# Patient Record
Sex: Female | Born: 1990 | Race: White | Hispanic: No | Marital: Single | Smoking: Never smoker
Health system: Southern US, Community
[De-identification: ages and names within clinical notes are randomized; demographics above are authoritative.]

## PROBLEM LIST (undated history)

## (undated) HISTORY — PX: OTHER SURGICAL HISTORY: SHX169

---
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
## 2011-12-30 IMAGING — CR DG CERVICAL SPINE 2 OR 3 VIEWS
2 series · 2 of 2 positions shown · non-contrast
Comparison: None.

CLINICAL DATA: 5 years chronic neck pain (soccer player) with no
specific injury.

CERVICAL SPINE - 2-3 VIEW

[view not recorded (1 of 2)]
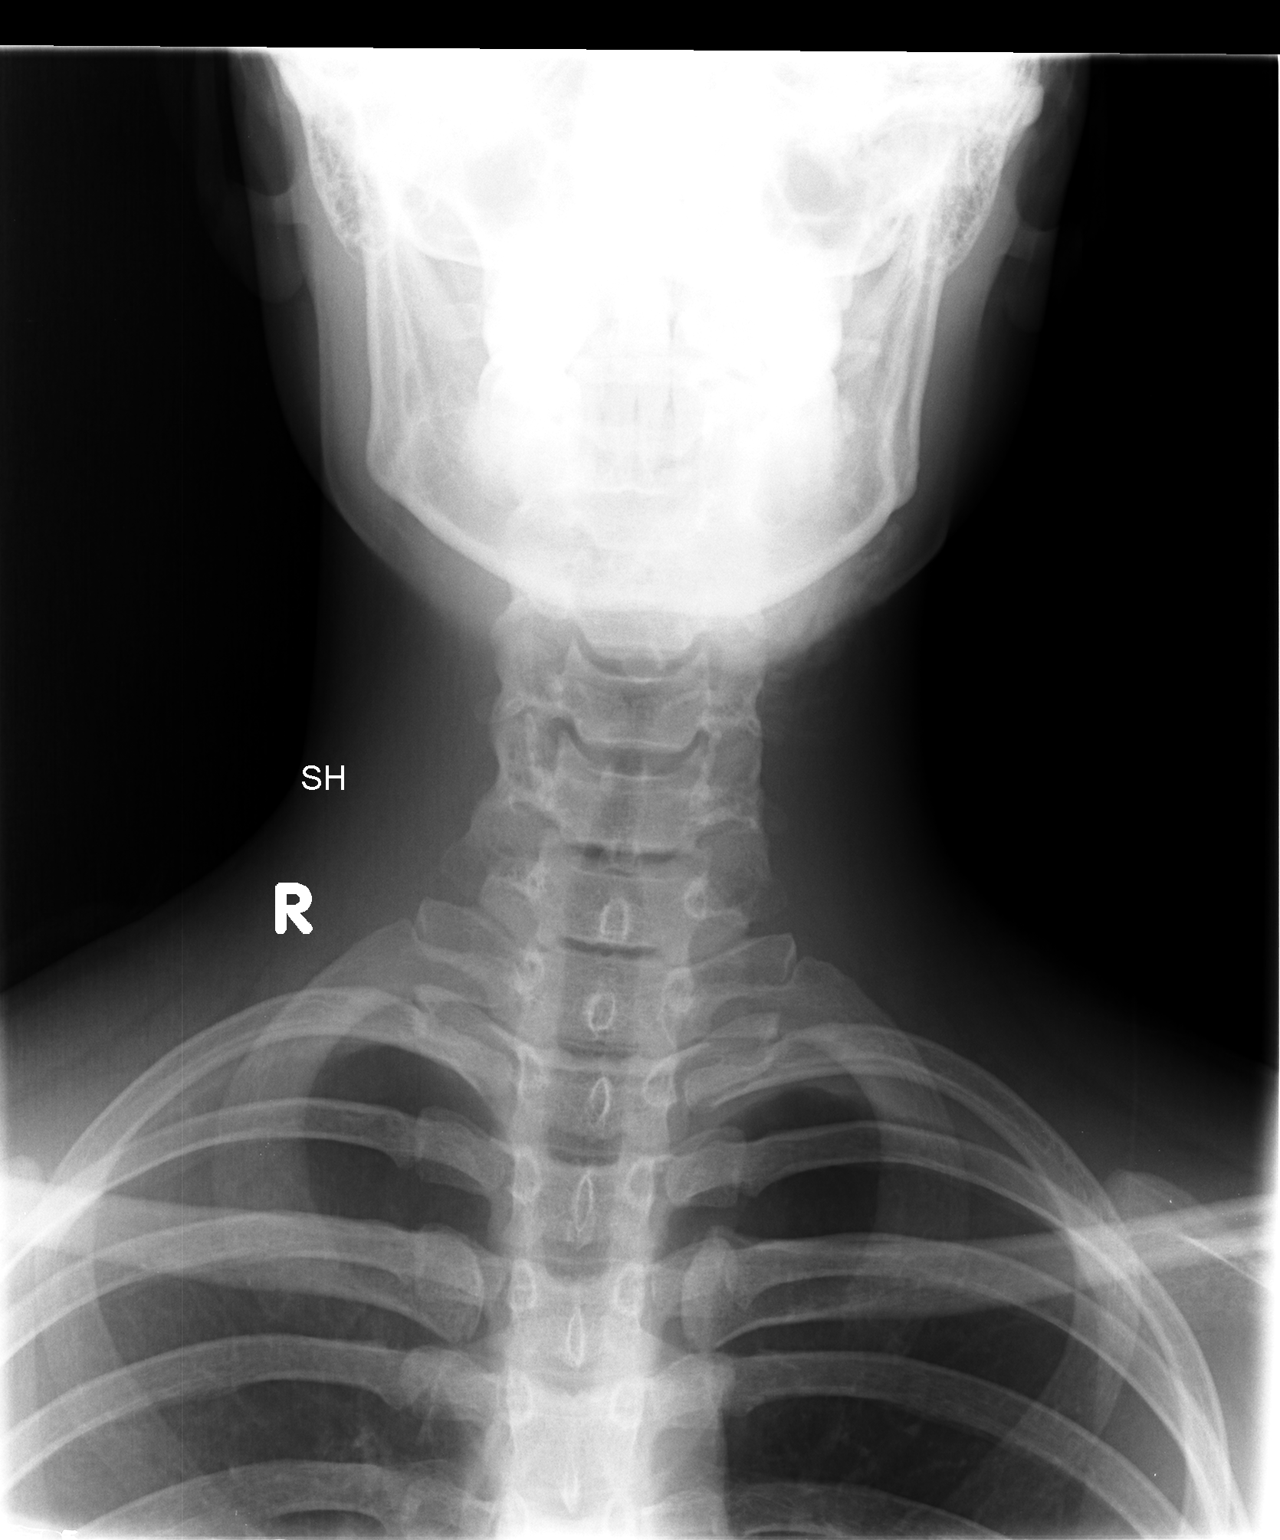

[view not recorded (2 of 2)]
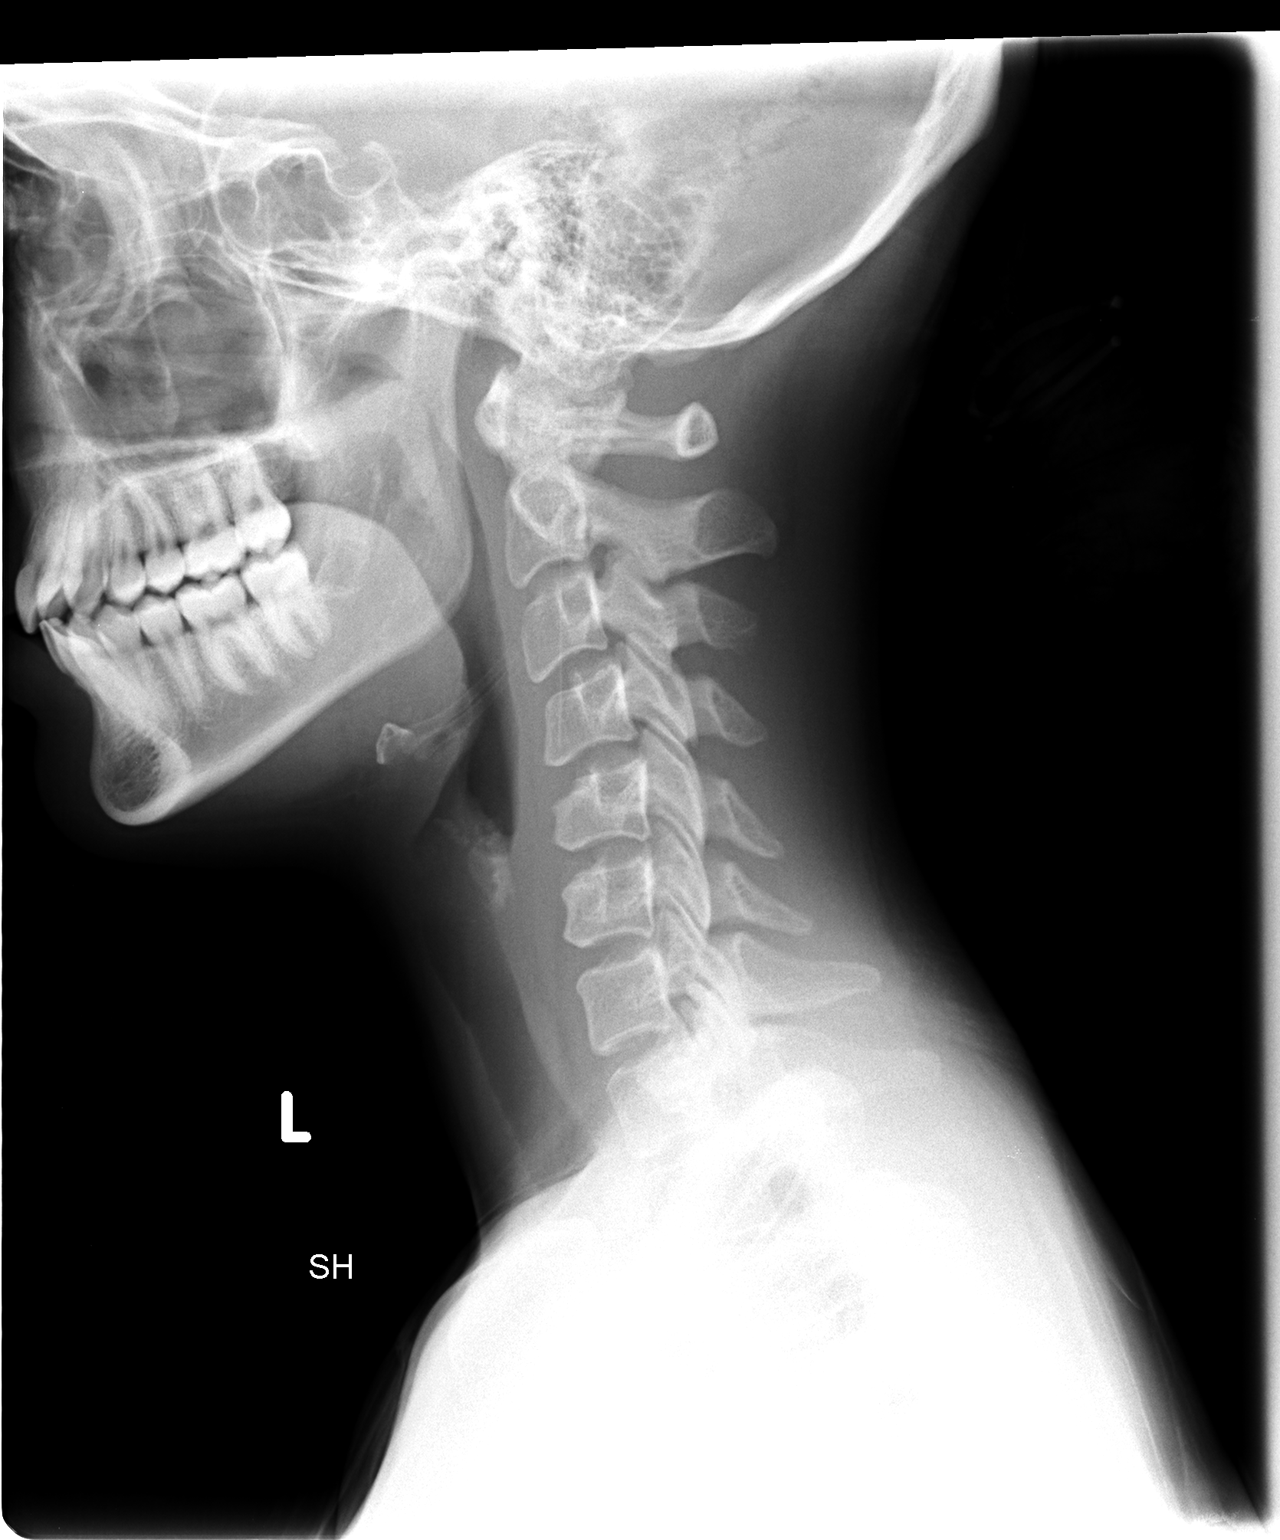

[2 of 2 positions shown; findings below may reference images not displayed]

FINDINGS: Slight reversal normal cervical lordosis consistent with
muscle spasm and/or positioning.

Vertebrae, disc spaces, and posterior vertebral alignment normally
maintained.
IMPRESSION: 1.  Slight reversal normal cervical lordosis consistent with muscle
spasm and/or positioning.
2.  Otherwise, negative.

## 2012-11-10 ENCOUNTER — Ambulatory Visit: Payer: Self-pay | Admitting: Obstetrics & Gynecology

## 2012-11-10 ENCOUNTER — Encounter: Payer: Self-pay | Admitting: Obstetrics & Gynecology

## 2012-11-10 DIAGNOSIS — Z01419 Encounter for gynecological examination (general) (routine) without abnormal findings: Secondary | ICD-10-CM

## 2012-11-24 ENCOUNTER — Encounter: Payer: Self-pay | Admitting: Gynecology

## 2012-11-24 ENCOUNTER — Ambulatory Visit (INDEPENDENT_AMBULATORY_CARE_PROVIDER_SITE_OTHER): Payer: Managed Care, Other (non HMO) | Admitting: Gynecology

## 2012-11-24 VITALS — BP 100/68 | Ht 65.0 in | Wt 127.0 lb

## 2012-11-24 DIAGNOSIS — Z0000 Encounter for general adult medical examination without abnormal findings: Secondary | ICD-10-CM

## 2012-11-24 DIAGNOSIS — Z01419 Encounter for gynecological examination (general) (routine) without abnormal findings: Secondary | ICD-10-CM

## 2012-11-24 DIAGNOSIS — Z8742 Personal history of other diseases of the female genital tract: Secondary | ICD-10-CM

## 2012-11-24 LAB — POCT URINALYSIS DIPSTICK
Leukocytes, UA: NEGATIVE
pH, UA: 5

## 2012-11-24 MED ORDER — NORETHINDRONE ACET-ETHINYL EST 1.5-30 MG-MCG PO TABS: 1.0000 | ORAL_TABLET | Freq: Every day | ORAL | Status: DC

## 2012-11-24 NOTE — Progress Notes (Signed)
22 y.o.   Single    Caucasian   female   G0P0000   here for annual exam.  Pt is not sexually active-ever, cycles  Monthly, no pain.  Jr at [employer], on oc for regulation of ovarian cysts.  Did not have HPV vaccine.  Going to [place] for July with school  LMP: 11/10/12        Sexually active: no  The current method of family planning is OCP (estrogen/progesterone).    Exercising: run 3-4x/wk Last mammogram:  none Last pap smear: never had one History of abnormal pap: none Smoking: no Alcohol: no Last colonoscopy:none Last Bone Density:  none Last tetanus shot: 2008 Last cholesterol check: not sure  Hgb:                Urine: Trace Leuks; Trace Blood   Family History  Problem Relation Age of Onset  . Diabetes Paternal Grandfather   . Intestinal polyp Maternal Grandmother     cancerous  . Infertility Mother     on meds for second child    There are no active problems to display for this patient.   No past medical history on file.  Past Surgical History  Procedure Laterality Date  . Broken arm      left/set under an  . Broken arm      right arm x2 (soccer)  . Hymenotomy / hymenectomy  6/12    Allergies: Penicillins  Current Outpatient Prescriptions  Medication Sig Dispense Refill  . Acetaminophen (TYLENOL PO) Take by mouth as needed.      . IBUPROFEN PO Take by mouth as needed.      . Nutritional Supplements (JUICE PLUS FIBRE PO) Take by mouth.       No current facility-administered medications for this visit.    ROS: Pertinent items are noted in HPI.  Social Hx:    Exam:    There were no vitals taken for this visit.   Wt Readings from Last 3 Encounters:  No data found for Wt     Ht Readings from Last 3 Encounters:  No data found for Ht    General appearance: alert, cooperative and appears stated age Head: Normocephalic, without obvious abnormality, atraumatic Neck: no adenopathy, supple, symmetrical, trachea midline and  thyroid not enlarged, symmetric, no tenderness/mass/nodules Lungs: clear to auscultation bilaterally Breasts: Inspection negative, No nipple retraction or dimpling, No nipple discharge or bleeding, No axillary or supraclavicular adenopathy, Normal to palpation without dominant masses Heart: regular rate and rhythm Abdomen: soft, non-tender; bowel sounds normal; no masses,  no organomegaly Extremities: extremities normal, atraumatic, no cyanosis or edema Skin: Skin color, texture, turgor normal. No rashes or lesions Lymph nodes: Cervical, supraclavicular, and axillary nodes normal. No abnormal inguinal nodes palpated Neurologic: Grossly normal   Pelvic: External genitalia:  no lesions              Urethra:  normal appearing urethra with no masses, tenderness or lesions              Bartholins and Skenes: normal                 Vagina: normal appearing vagina with normal color and discharge, no lesions              Cervix: normal appearance              Pap taken: no        Bimanual Exam:  Uterus:  uterus is normal size, shape, consistency and nontender                                      Adnexa: normal adnexa in size, nontender and no masses                                     A: normal exam History of 3 arm fractures     P: pap smear next year Discussed increasing estrogen to to enhance bone mineralization-agrees counseled on STD prevention, use and side effects of OCP's, condoms if sexually active return annually or prn     An After Visit Summary was printed and given to the patient.

## 2012-11-24 NOTE — Patient Instructions (Signed)
HPV Vaccine Questions and Answers WHAT IS HUMAN PAPILLOMAVIRUS (HPV)? HPV is a virus that can lead to cervical cancer; vulvar and vaginal cancers; penile cancer; anal cancer and genital warts (warts in the genital areas). More than 1 vaccine is available to help you or your child with protection against HPV. Your caregiver can talk to you about which one might give you the best protection. WHO SHOULD GET THIS VACCINE? The HPV vaccine is most effective when given before the onset of sexual activity.  This vaccine is recommended for girls 11 or 21 years of age. It can be given to girls as young as 21 years old.  HPV vaccine can be given to males, 9 through 21 years of age, to reduce the likelihood of acquiring genital warts.  HPV vaccine can be given to males and females aged 9 through 26 years to prevent anal cancer. HPV vaccine is not generally recommended after age 26, because most individuals have been exposed to the HPV virus by that age. HOW EFFECTIVE IS THIS VACCINE?  The vaccine is generally effective in preventing cervical; vulvar and vaginal cancers; penile cancer; anal cancer and genital warts caused by 4 types of HPV. The vaccine is less effective in those individuals who are already infected with HPV. This vaccine does not treat existing HPV, genital warts, pre-cancers or cancers. WILL SEXUALLY ACTIVE INDIVIDUALS BENEFIT FROM THE VACCINE? Sexually active individuals may still benefit from the vaccine but may get less benefit due to previous HPV exposure. HOW AND WHEN IS THE VACCINE ADMINISTERED? The vaccine is given in a series of 3 injections (shots) over a 6 month period in both males and females. The exact timing depends on which specific vaccine your caregiver recommends for you. IS THE HPV VACCINE SAFE?  The federal government has approved the HPV vaccine as safe and effective. This vaccine was tested in both males and females in many countries around the world. The most common  side effect is soreness at the injection site. Since the drug became approved, there has been some concern about patients passing out after being vaccinated, which has led to a recommendation of a 15 minute waiting period following vaccination. This practice may decrease the small risk of passing out. Additionally there is a rare risk of anaphylaxis (an allergic reaction) to the vaccine and a risk of a blood clot among individuals with specific risk factors for a blood clot. DOES THIS VACCINE CONTAIN THIMEROSAL OR MERCURY? No. There is no thimerosal or mercury in the HPV vaccine. It is made of proteins from the outer coat of the virus (HPV). There is no infectious material in this vaccine. WILL GIRLS/WOMEN WHO HAVE BEEN VACCINATED STILL NEED CERVICAL CANCER SCREENING? Yes. There are 3 reasons why women will still need regular cervical cancer screening. First, the vaccine will NOT provide protection against all types of HPV that cause cervical cancer. Vaccinated women will still be at risk for some cancers. Second, some women may not get all required doses of the vaccine (or they may not get them at the recommended times). Therefore, they may not get the vaccine's full benefits. Third, women may not get the full benefit of the vaccine if they receive it after they have already acquired any of the 4 types of HPV. WILL THE HPV VACCINE BE COVERED BY INSURANCE PLANS? While some insurance companies may cover the vaccine, others may not. Most large group insurance plans cover the costs of recommended vaccines. WHAT KIND OF GOVERNMENT PROGRAMS   MAY BE AVAILABLE TO COVER HPV VACCINE? Federal health programs such as Vaccines for Children (VFC) will cover the HPV vaccine. The VFC program provides free vaccines to children and adolescents under 19 years of age, who are either uninsured, Medicaid-eligible, [place] Indian or [place] Native. There are over 45,000 sites that provide VFC vaccines including hospital, private  and public clinics. The VFC program also allows children and adolescents to get VFC vaccines through Federally Qualified Health Centers or Rural Health Centers if their private health insurance does not cover the vaccine. Some states also provide free or low-cost vaccines, at public health clinics, to people without health insurance coverage for vaccines. GENITAL HPV: WHY IS HPV IMPORTANT? Genital HPV is the most common virus transmitted through genital contact, most often during vaginal and anal sex. About 40 types of HPV can infect the genital areas of men and women. While most HPV types cause no symptoms and go away on their own, some types can cause cervical cancer in women. These types also cause other less common genital cancers, including cancers of the penis, anus, vagina (birth canal), and vulva (area around the opening of the vagina). Other types of HPV can cause genital warts in men and women. HOW COMMON IS HPV?   At least 50% of sexually active people will get HPV at some time in their lives. HPV is most common in young women and men who are in their late teens and early 20s.  Anyone who has ever had genital contact with another person can get HPV. Both men and women can get it and pass it on to their sex partners without realizing it. IS HPV THE SAME THING AS HIV OR HERPES? HPV is NOT the same as HIV or Herpes (Herpes simplex virus or HSV). While these are all viruses that can be sexually transmitted, HIV and HSV do not cause the same symptoms or health problems as HPV. CAN HPV AND ITS ASSOCIATED DISEASES BE TREATED? There is no treatment for HPV. There are treatments for the health problems that HPV can cause, such as genital warts, cervical cell changes, and cancers of the cervix (lower part of the womb), vulva, vagina and anus.  HOW IS HPV RELATED TO CERVICAL CANCER? Some types of HPV can infect a woman's cervix and cause the cells to change in an abnormal way. Most of the time, HPV goes  away on its own. When HPV is gone, the cervical cells go back to normal. Sometimes, HPV does not go away. Instead, it lingers (persists) and continues to change the cells on a woman's cervix. These cell changes can lead to cancer over time if they are not treated. ARE THERE OTHER WAYS TO PREVENT CERVICAL CANCER? Regular Pap tests and follow-up can prevent most, but not all, cases of cervical cancer. Pap tests can detect cell changes (or pre-cancers) in the cervix before they turn into cancer. Pap tests can also detect most, but not all, cervical cancers at an early, curable stage. Most women diagnosed with cervical cancer have either never had a Pap test, or not had a Pap test in the last 5 years. There is also an HPV DNA test available for use with the Pap test as part of cervical cancer screening. This test may be ordered for women over 30 or for women who get an unclear (borderline) Pap test result. While this test can tell if a woman has HPV on her cervix, it cannot tell which types of HPV she has.   If the HPV DNA test is negative for HPV DNA, then screening may be done every 3 years. If the HPV DNA test is positive for HPV DNA, then screening should be done every 6 to 12 months. OTHER QUESTIONS ABOUT THE HPV VACCINE WHAT HPV TYPES DOES THE VACCINE PROTECT AGAINST? The HPV vaccine protects against the HPV types that cause most (70%) cervical cancers (types 16 and 18), most (78%) anal cancers (types 16 and 18) and the two HPV types that cause most (90%) genital warts (types 6 and 11). WHAT DOES THE VACCINE NOT PROTECT AGAINST?  Because the vaccine does not protect against all types of HPV, it will not prevent all cases of cervical cancer, anal cancer, other genital cancers or genital warts. About 30% of cervical cancers are not prevented with vaccination, so it will be important for women to continue screening for cervical cancer (regular Pap tests). Also, the vaccine does not prevent about 10% of genital  warts nor will it prevent other sexually transmitted infections (STIs), including HIV. Therefore, it will still be important for sexually active adults to practice safe sex to reduce exposure to HPV and other STI's. HOW LONG DOES VACCINE PROTECTION LAST? WILL A BOOSTER SHOT BE NEEDED? So far, studies have followed women for 5 years and found that they are still protected. Currently, additional (booster) doses are not recommended. More research is being done to find out how long protection will last, and if a booster vaccine is needed years later.  WHY IS THE HPV VACCINE RECOMMENDED AT SUCH A YOUNG AGE? Ideally, males and females should get the vaccine before they are sexually active since this vaccine is most effective in individuals who have not yet acquired any of the HPV vaccine types. Individuals who have not been infected with any of the 4 types of HPV will get the full benefits of the vaccine.  SHOULD PREGNANT WOMEN BE VACCINATED? The vaccine is not recommended for pregnant women. There has been limited research looking at vaccine safety for pregnant women and their developing fetus. Studies suggest that the vaccine has not caused health problems during pregnancy, nor has it caused health problems for the infant. Pregnant women should complete their pregnancy before getting the vaccine. If a woman finds out she is pregnant after she has started getting the vaccine series, she should complete her pregnancy before finishing the 3 doses. SHOULD BREASTFEEDING MOTHERS BE VACCINATED? Mothers nursing their babies may get the vaccine because the virus is inactivated and will not harm the mother or baby. WILL INDIVIDUALS BE PROTECTED AGAINST HPV AND RELATED DISEASES, EVEN IF THEY DO NOT GET ALL 3 DOSES? It is not yet known how much protection individuals will get from receiving only 1 or 2 doses of the vaccine. For this reason, it is very important that individuals get all 3 doses of the vaccine. WILL  CHILDREN BE REQUIRED TO BE VACCINATED TO ENTER SCHOOL? There are no federal laws that require children or adolescents to get vaccinated. All school entry laws are state laws so they vary from state to state. To find out what vaccines are needed for children or adolescents to enter school in your state, check with your state health department or board of education. ARE THERE OTHER WAYS TO PREVENT HPV? The only sure way to prevent HPV is to abstain from all sexual activity. Sexually active adults can reduce their risk by being in a mutually monogamous relationship with someone who has had no other sex partners.   But even individuals with only 1 lifetime sex partner can get HPV, if their partner has had a previous partner with HPV. It is unknown how much protection condoms provide against HPV, since areas that are not covered by a condom can be exposed to the virus. However, condoms may reduce the risk of genital warts and cervical cancer. They can also reduce the risk of HIV and some other sexually transmitted infections (STIs), when used consistently and correctly (all the time and the right way). Document Released: 06/11/2005 Document Revised: 09/03/2011 Document Reviewed: 02/04/2009 [employer] Patient Information 2014 [employer].  

## 2012-11-25 ENCOUNTER — Telehealth: Payer: Self-pay | Admitting: Gynecology

## 2012-11-25 NOTE — Telephone Encounter (Signed)
The [facility] that Dr. Farrel Gobble put her on has more estrogen in them than the previous Rx.  Patient doesn't  want to change to that until she talks to Dr. Sunday Corn.  Can she keep her old RX.

## 2012-11-26 ENCOUNTER — Other Ambulatory Visit: Payer: Self-pay | Admitting: Obstetrics & Gynecology

## 2012-11-26 MED ORDER — NORETHIN ACE-ETH ESTRAD-FE 1-20 MG-MCG PO TABS: 1.0000 | ORAL_TABLET | Freq: Every day | ORAL | Status: DC

## 2012-11-26 NOTE — Telephone Encounter (Signed)
Patient notified that dr Hyacinth Meeker has sent Loestrin 1/20 to her pharmacy.  She has not picked up any medications so should not have any problem getting the Loestrin 1/20 but to call back if needed.

## 2012-11-26 NOTE — Telephone Encounter (Signed)
I changed her RX to the Loestrin 1/20 that she was on and did well on.  Let her know it should be at the pharmacy.  If she's gotten the other RX filled, she may not be able to get another birth control rx until she's used those.  FYI.

## 2012-11-26 NOTE — Telephone Encounter (Signed)
Patient returning Sue's call.

## 2012-11-26 NOTE — Telephone Encounter (Signed)
See note below. Patient has appointment with Dr. Hyacinth Meeker on 12/22/2012. Saw Dr. Farrel Gobble on 11/24/2012. Please advise. sue

## 2012-11-26 NOTE — Telephone Encounter (Signed)
Left message for patient to return call to office for information for [facility] pills.

## 2013-02-13 ENCOUNTER — Ambulatory Visit: Payer: Self-pay | Admitting: Obstetrics & Gynecology

## 2013-06-04 ENCOUNTER — Telehealth: Payer: Self-pay | Admitting: Obstetrics & Gynecology

## 2013-06-04 MED ORDER — NORETHIN ACE-ETH ESTRAD-FE 1-20 MG-MCG PO TABS: 1.0000 | ORAL_TABLET | Freq: Every day | ORAL | Status: DC

## 2013-06-04 NOTE — Telephone Encounter (Signed)
Patient is going [place] study [place]road [place] the [place] and her bc prescription is only 3 months at a time she wanted to know if we could change it so she could get 5 months in January to get her until she returns to the [place].

## 2013-06-04 NOTE — Telephone Encounter (Signed)
Dr. Hyacinth Meeker, do you agree to Rx dispense #5? I will advise patient may have to pay out of pocket if insurance does not cover extra months of rx.

## 2013-06-04 NOTE — Telephone Encounter (Signed)
Rx done and sent to pharmacy.  Note made for RFs and [place].  Please let her know this is done.  Encounter closed.

## 2013-06-05 NOTE — Telephone Encounter (Signed)
Patient notified[facility]

## 2013-12-22 ENCOUNTER — Ambulatory Visit (INDEPENDENT_AMBULATORY_CARE_PROVIDER_SITE_OTHER): Payer: BC Managed Care – PPO | Admitting: Obstetrics & Gynecology

## 2013-12-22 ENCOUNTER — Encounter: Payer: Self-pay | Admitting: Obstetrics & Gynecology

## 2013-12-22 VITALS — BP 98/60 | HR 80 | Resp 16 | Ht 65.0 in | Wt 134.8 lb

## 2013-12-22 DIAGNOSIS — G43009 Migraine without aura, not intractable, without status migrainosus: Secondary | ICD-10-CM | POA: Insufficient documentation

## 2013-12-22 DIAGNOSIS — Z01419 Encounter for gynecological examination (general) (routine) without abnormal findings: Secondary | ICD-10-CM

## 2013-12-22 DIAGNOSIS — Z124 Encounter for screening for malignant neoplasm of cervix: Secondary | ICD-10-CM

## 2013-12-22 MED ORDER — NORETHIN ACE-ETH ESTRAD-FE 1-20 MG-MCG PO TABS: 1.0000 | ORAL_TABLET | Freq: Every day | ORAL | Status: DC

## 2013-12-22 NOTE — Progress Notes (Signed)
23 y.o. G0P0000 SingleCaucasianF here for annual exam.  About 10 days ago, pt had what she felt was a migraine.  Brother and mother has migraine hx.  Had some associated nausea.  No aura.  Took a medicine her mother had and headache resolved in 20 minutes.  D/W importance of migraine with aura.  She will let me now if has another.    Cycles are regular.  Not dating anyone.  Not sexually.  Did semester in [place] last year.  [employer].    Patient's last menstrual period was 12/08/2013.          Sexually active: No.  The current method of family planning is OCP (estrogen/progesterone).    Exercising: No.  not regularly Smoker:  no  Health Maintenance: Pap:  none History of abnormal Pap:  no MMG:  none Colonoscopy:  none BMD:   none TDaP:  ?2005-declines today Screening Labs: declines, Hb today: declines, Urine today: declines   reports that she has never smoked. She has never used smokeless tobacco. She reports that she does not drink alcohol or use illicit drugs.  History reviewed. No pertinent past medical history.  Past Surgical History  Procedure Laterality Date  . Broken arm      left/set under an  . Broken arm      right arm x2 (soccer)  . 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 Acetaminophen (TYLENOL PO) Take by mouth as needed.      . IBUPROFEN PO Take by mouth as needed.      . norethindrone-ethinyl estradiol (JUNEL FE,GILDESS FE,LOESTRIN FE) 1-20 MG-MCG tablet Take 1 tablet by mouth daily.  5 Package  2  . Nutritional Supplements (JUICE PLUS FIBRE PO) Take by mouth.       No current facility-administered medications for this visit.    Family History  Problem Relation Age of Onset  . Diabetes Paternal Grandfather   . Intestinal polyp Maternal Grandmother     cancerous  . Infertility Mother     on meds for second child    ROS:  Pertinent items are noted in HPI.  Otherwise, a comprehensive ROS was  negative.  Exam:   BP 98/60  Pulse 80  Resp 16  Ht 5\' 5"  (1.651 m)  Wt 134 lb 12.8 oz (61.145 kg)  BMI 22.43 kg/m2  LMP 12/08/2013 Height: 5\' 5"  (165.1 cm)  Ht Readings from Last 3 Encounters:  12/22/13 5\' 5"  (1.651 m)  11/24/12 5\' 5"  (1.651 m)    General appearance: alert, cooperative and appears stated age Head: Normocephalic, without obvious abnormality, atraumatic Neck: no adenopathy, supple, symmetrical, trachea midline and thyroid normal to inspection and palpation Lungs: clear to auscultation bilaterally Breasts: normal appearance, no masses or tenderness Heart: regular rate and rhythm Abdomen: soft, non-tender; bowel sounds normal; no masses,  no organomegaly Extremities: extremities normal, atraumatic, no cyanosis or edema Skin: Skin color, texture, turgor normal. No rashes or lesions Lymph nodes: Cervical, supraclavicular, and axillary nodes normal. No abnormal inguinal nodes palpated Neurologic: Grossly normal   Pelvic: External genitalia:  no lesions              Urethra:  normal appearing urethra with no masses, tenderness or lesions              Bartholins and Skenes: normal                 Vagina: normal  appearing vagina with normal color and discharge, no lesions              Cervix: no lesions              Pap taken: Yes.   Bimanual Exam:  Uterus:  normal size, contour, position, consistency, mobility, non-tender              Adnexa: normal adnexa and no mass, fullness, tenderness               Rectovaginal: Confirms               Anus:  normal sphincter tone, no lesions  A:  Well Woman with normal exam Not SA Migraine this year for the first time.  + family hx On OCPs for cycle control  P:   Mammogram starting at age 23.  SBEs taught today pap smear obtained today. Rx for OCPs for 1 year given.  Reviewed risks and side effects.  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   If wants rx for  migraine medication, she will call and I can call it into pharmacy.  return annually or prn  An After Visit Summary was printed and given to the patient.

## 2013-12-28 LAB — IPS PAP TEST WITH REFLEX TO HPV

## 2014-01-04 ENCOUNTER — Telehealth: Payer: Self-pay | Admitting: Obstetrics & Gynecology

## 2014-01-04 NOTE — Telephone Encounter (Signed)
Patient was told to call if she has another migraine. Patient says Dr.Miller would call in a prescription. Pharmacy on file.

## 2014-01-04 NOTE — Telephone Encounter (Signed)
Call to assess headache.  If no aura will send to Dr. Hyacinth MeekerMiller for review and place rx.

## 2014-01-05 MED ORDER — SUMATRIPTAN SUCCINATE 100 MG PO TABS: ORAL_TABLET | ORAL | Status: DC

## 2014-01-05 NOTE — Telephone Encounter (Signed)
Rx order sent to her pharmacy.  Encounter closed.

## 2014-01-05 NOTE — Telephone Encounter (Signed)
Not sure I sent last message to you .  Rx sent to pharmacy.  Encounter closed.

## 2014-01-05 NOTE — Telephone Encounter (Signed)
Patient calling. States migraine symptoms are the same as when seen by Dr. Hyacinth MeekerMiller. No visual changes, weakness or aura's. States that Mother uses Sumatriptan 100 mg and that it worked well for her. [facility] [address] Advised I would call back when orders placed.

## 2014-01-06 NOTE — Telephone Encounter (Signed)
Message left to advise The Prescription that you have requested was sent to your pharmacy if you have any questions,  return call to [facility] at [phone]. Detailed message left to advise rx sent and instructions on to follow up if headaches persist. Detailed message left okay on mobile per designated party release form.

## 2014-12-31 ENCOUNTER — Other Ambulatory Visit: Payer: Self-pay | Admitting: Obstetrics & Gynecology

## 2014-12-31 NOTE — Telephone Encounter (Signed)
Medication refill request: Junel Fe 1/20 Last AEX:  12/22/2013 with SM Next AEX: 01/21/15 with SM Last MMG (if hormonal medication request): n/a Refill authorized: Please advise.

## 2015-01-21 ENCOUNTER — Ambulatory Visit: Payer: Managed Care, Other (non HMO) | Admitting: Obstetrics & Gynecology

## 2015-03-17 ENCOUNTER — Ambulatory Visit (INDEPENDENT_AMBULATORY_CARE_PROVIDER_SITE_OTHER): Payer: BLUE CROSS/BLUE SHIELD | Admitting: Obstetrics and Gynecology

## 2015-03-17 ENCOUNTER — Encounter: Payer: Self-pay | Admitting: Obstetrics and Gynecology

## 2015-03-17 VITALS — BP 122/78 | HR 92 | Resp 15 | Ht 65.0 in | Wt 137.0 lb

## 2015-03-17 DIAGNOSIS — Z01419 Encounter for gynecological examination (general) (routine) without abnormal findings: Secondary | ICD-10-CM

## 2015-03-17 DIAGNOSIS — Z3041 Encounter for surveillance of contraceptive pills: Secondary | ICD-10-CM | POA: Diagnosis not present

## 2015-03-17 MED ORDER — NORETHIN ACE-ETH ESTRAD-FE 1-20 MG-MCG PO TABS: 1.0000 | ORAL_TABLET | Freq: Every day | ORAL | Status: DC

## 2015-03-17 NOTE — Patient Instructions (Signed)
EXERCISE AND DIET:  We recommended that you start or continue a regular exercise program for good health. Regular exercise means any activity that makes your heart beat faster and makes you sweat.  We recommend exercising at least 30 minutes per day at least 3 days a week, preferably 4 or 5.  We also recommend a diet low in fat and sugar.  Inactivity, poor dietary choices and obesity can cause diabetes, heart attack, stroke, and kidney damage, among others.    ALCOHOL AND SMOKING:  Women should limit their alcohol intake to no more than 7 drinks/beers/glasses of wine (combined, not each!) per week. Moderation of alcohol intake to this level decreases your risk of breast cancer and liver damage. And of course, no recreational drugs are part of a healthy lifestyle.  And absolutely no smoking or even second hand smoke. Most people know smoking can cause heart and lung diseases, but did you know it also contributes to weakening of your bones? Aging of your skin?  Yellowing of your teeth and nails?  CALCIUM AND VITAMIN D:  Adequate intake of calcium and Vitamin D are recommended.  The recommendations for exact amounts of these supplements seem to change often, but generally speaking 600 mg of calcium (either carbonate or citrate) and 800 units of Vitamin D per day seems prudent. Certain women may benefit from higher intake of Vitamin D.  If you are among these women, your doctor will have told you during your visit.    PAP SMEARS:  Pap smears, to check for cervical cancer or precancers,  have traditionally been done yearly, although recent scientific advances have shown that most women can have pap smears less often.  However, every woman still should have a physical exam from her gynecologist every year. It will include a breast check, inspection of the vulva and vagina to check for abnormal growths or skin changes, a visual exam of the cervix, and then an exam to evaluate the size and shape of the uterus and  ovaries.  And after 23 years of age, a rectal exam is indicated to check for rectal cancers. We will also provide age appropriate advice regarding health maintenance, like when you should have certain vaccines, screening for sexually transmitted diseases, bone density testing, colonoscopy, mammograms, etc.      

## 2015-03-17 NOTE — Progress Notes (Addendum)
Patient ID: Amy Hobbs, female   DOB: 1990-10-05, 24 y.o.   MRN: [id number] 24 y.o. G0P0000 SingleCaucasianF here for annual exam.   The patient is happy with OCP's. Occasional migraines, no aura's. Never sexually active, on contraception to prevent ovarian cyst. No boyfriend.   Period Cycle (Days): 28 Period Pattern: Regular Menstrual Flow: Light, Moderate Menstrual Control: Tampon Dysmenorrhea: None  Patient's last menstrual period was 02/27/2015.          Sexually active: No.  The current method of family planning is OCP (estrogen/progesterone).    Exercising: Yes.    walking, running and yoga Smoker:  no  Health Maintenance: Pap:  12-22-13 WNL History of abnormal Pap:  no TDaP:  Unsure  Gardasil: No   reports that she has never smoked. She has never used smokeless tobacco. She reports that she does not drink alcohol or use illicit drugs. Just graduated, working in a [employer], [employer], [employer] systems school. Living with a roommate.   History reviewed. No pertinent past medical history.  Past Surgical History  Procedure Laterality Date  . Broken arm      left/set under an  . Broken arm      right arm x2 (soccer)  . 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 Acetaminophen (TYLENOL PO) Take by mouth as needed.    . IBUPROFEN PO Take by mouth as needed.    Colleen Can FE 1/20 1-20 MG-MCG tablet TAKE 1 TABLET BY MOUTH DAILY. 84 tablet 0  . Nutritional Supplements (JUICE PLUS FIBRE PO) Take by mouth.    . SUMAtriptan (IMITREX) 100 MG tablet Take one tab at headache onset.  May repeat in 2 hours if headache persists or recurs. 9 tablet 3   No current facility-administered medications for this visit.    Family History  Problem Relation Age of Onset  . Diabetes Paternal Grandfather   . Intestinal polyp Maternal Grandmother     cancerous  . Infertility Mother     on meds for second child    Review of Systems   Constitutional: Negative.   HENT: Negative.   Eyes: Negative.   Respiratory: Negative.   Cardiovascular: Negative.   Gastrointestinal: Negative.   Endocrine: Negative.   Genitourinary: Negative.   Musculoskeletal: Negative.   Skin: Negative.   Allergic/Immunologic: Negative.   Neurological: Negative.   Psychiatric/Behavioral: Negative.     Exam:   BP 122/78 mmHg  Pulse 92  Resp 15  Ht  (1.651 m)  Wt 137 lb (62.143 kg)  BMI 22.80 kg/m2  LMP 02/27/2015  Weight change: @ Height:   Height:  (165.1 cm)  Ht Readings from Last 3 Encounters:  03/17/15  (1.651 m)  12/22/13  (1.651 m)  11/24/12  (1.651 m)    General appearance: alert, cooperative and appears stated age Head: Normocephalic, without obvious abnormality, atraumatic Neck: no adenopathy, supple, symmetrical, trachea midline and thyroid normal to inspection and palpation Lungs: clear to auscultation bilaterally Breasts: normal appearance, no masses or tenderness Heart: regular rate and rhythm Abdomen: soft, non-tender; bowel sounds normal; no masses,  no organomegaly Extremities: extremities normal, atraumatic, no cyanosis or edema Skin: Skin color, texture, turgor normal. No rashes or lesions Lymph nodes: Cervical, supraclavicular, and axillary nodes normal. No abnormal inguinal nodes palpated Neurologic: Grossly normal   Pelvic: External genitalia:  no lesions  Urethra:  normal appearing urethra with no masses, tenderness or lesions              Bartholins and Skenes: normal                 Vagina: normal appearing vagina with normal color and discharge, no lesions. Virginal introitus. Able to insert pediatric speculum.               Cervix: no lesions               Bimanual Exam:  Uterus:  normal size, contour, position, consistency, mobility, non-tender              Adnexa: no mass, fullness, tenderness               Rectovaginal: Confirms               Anus:   normal sphincter tone, no lesions  Chaperone was present for exam.  A:  Well Woman with normal exam  P:   No pap this year  Not sexually active, no STD testing needed  Continue OCP's   Addendum: gardasil 9 information reviewed and given.

## 2015-08-10 ENCOUNTER — Other Ambulatory Visit: Payer: Self-pay | Admitting: Obstetrics & Gynecology

## 2015-08-10 MED ORDER — SUMATRIPTAN SUCCINATE 100 MG PO TABS: ORAL_TABLET | ORAL | Status: DC

## 2015-08-10 NOTE — Telephone Encounter (Signed)
Medication refill request: Imitrex Last AEX:  03-17-15 Next AEX: 04-19-16 Last MMG (if hormonal medication request): N/A Refill authorized: please advise

## 2015-08-10 NOTE — Telephone Encounter (Signed)
Patient requesting refill of [facility] [address] in [place]. Best # to reach: [phone]

## 2016-03-01 ENCOUNTER — Other Ambulatory Visit: Payer: Self-pay | Admitting: Obstetrics and Gynecology

## 2016-03-01 MED ORDER — NORETHIN ACE-ETH ESTRAD-FE 1-20 MG-MCG PO TABS: 1.0000 | ORAL_TABLET | Freq: Every day | ORAL | 0 refills | Status: DC

## 2016-03-01 NOTE — Telephone Encounter (Signed)
Patient calling requesting a refill on her birth control. She lives out of town. Pharmacy information below[facility] [place]

## 2016-03-01 NOTE — Telephone Encounter (Signed)
Medication refill request: Norethindrone-Ethinyl Estradiol Last AEX:  03/17/15 JJ Next AEX: 06/13/16 JJ Last MMG (if hormonal medication request): n/a Refill authorized: 03/17/15 #[id number]. Patient lives out of town now. Pharmacy on file is correct. Please advise. Thank you.

## 2016-04-19 ENCOUNTER — Ambulatory Visit: Payer: BLUE CROSS/BLUE SHIELD | Admitting: Obstetrics and Gynecology

## 2016-05-27 ENCOUNTER — Other Ambulatory Visit: Payer: Self-pay | Admitting: Obstetrics and Gynecology

## 2016-05-28 NOTE — Telephone Encounter (Signed)
Patient calling to check status of a refill request for [facility] [place] [facility] [address] at [phone].

## 2016-05-28 NOTE — Telephone Encounter (Signed)
Medication refill request: Norethindrone-ethinyl estradiol Last AEX:  03/17/15 JJ Next AEX: 06/13/16 JJ Last MMG (if hormonal medication request): n/a Refill authorized: 03/01/16 #[id number]. Please advise. Thank you.

## 2016-06-13 ENCOUNTER — Ambulatory Visit: Payer: BLUE CROSS/BLUE SHIELD | Admitting: Obstetrics and Gynecology

## 2016-07-05 ENCOUNTER — Ambulatory Visit (INDEPENDENT_AMBULATORY_CARE_PROVIDER_SITE_OTHER): Payer: BLUE CROSS/BLUE SHIELD | Admitting: Obstetrics and Gynecology

## 2016-07-05 ENCOUNTER — Encounter: Payer: Self-pay | Admitting: Obstetrics and Gynecology

## 2016-07-05 VITALS — BP 118/60 | HR 96 | Resp 16 | Ht 64.75 in | Wt 136.0 lb

## 2016-07-05 DIAGNOSIS — Z113 Encounter for screening for infections with a predominantly sexual mode of transmission: Secondary | ICD-10-CM

## 2016-07-05 DIAGNOSIS — Z7189 Other specified counseling: Secondary | ICD-10-CM

## 2016-07-05 DIAGNOSIS — Z23 Encounter for immunization: Secondary | ICD-10-CM

## 2016-07-05 DIAGNOSIS — G43829 Menstrual migraine, not intractable, without status migrainosus: Secondary | ICD-10-CM

## 2016-07-05 DIAGNOSIS — Z0000 Encounter for general adult medical examination without abnormal findings: Secondary | ICD-10-CM

## 2016-07-05 DIAGNOSIS — R300 Dysuria: Secondary | ICD-10-CM | POA: Diagnosis not present

## 2016-07-05 DIAGNOSIS — Z01419 Encounter for gynecological examination (general) (routine) without abnormal findings: Secondary | ICD-10-CM | POA: Diagnosis not present

## 2016-07-05 DIAGNOSIS — Z124 Encounter for screening for malignant neoplasm of cervix: Secondary | ICD-10-CM

## 2016-07-05 DIAGNOSIS — Z7185 Encounter for immunization safety counseling: Secondary | ICD-10-CM

## 2016-07-05 LAB — POCT URINALYSIS DIPSTICK
Bilirubin, UA: NEGATIVE
Glucose, UA: NEGATIVE
KETONES UA: NEGATIVE
Nitrite, UA: NEGATIVE
PH UA: 6
PROTEIN UA: NEGATIVE
UROBILINOGEN UA: NEGATIVE

## 2016-07-05 LAB — URINALYSIS, MICROSCOPIC ONLY
Bacteria, UA: NONE SEEN [HPF]
Casts: NONE SEEN [LPF]
Crystals: NONE SEEN [HPF]
YEAST: NONE SEEN [HPF]

## 2016-07-05 MED ORDER — SUMATRIPTAN SUCCINATE 100 MG PO TABS: ORAL_TABLET | ORAL | 3 refills | Status: DC

## 2016-07-05 MED ORDER — LEVONORGEST-ETH ESTRAD 91-DAY 0.1-0.02 & 0.01 MG PO TABS: 1.0000 | ORAL_TABLET | Freq: Every day | ORAL | 4 refills | Status: DC

## 2016-07-05 NOTE — Patient Instructions (Signed)
EXERCISE AND DIET:  We recommended that you start or continue a regular exercise program for good health. Regular exercise means any activity that makes your heart beat faster and makes you sweat.  We recommend exercising at least 30 minutes per day at least 3 days a week, preferably 4 or 5.  We also recommend a diet low in fat and sugar.  Inactivity, poor dietary choices and obesity can cause diabetes, heart attack, stroke, and kidney damage, among others.    ALCOHOL AND SMOKING:  Women should limit their alcohol intake to no more than 7 drinks/beers/glasses of wine (combined, not each!) per week. Moderation of alcohol intake to this level decreases your risk of breast cancer and liver damage. And of course, no recreational drugs are part of a healthy lifestyle.  And absolutely no smoking or even second hand smoke. Most people know smoking can cause heart and lung diseases, but did you know it also contributes to weakening of your bones? Aging of your skin?  Yellowing of your teeth and nails?  CALCIUM AND VITAMIN D:  Adequate intake of calcium and Vitamin D are recommended.  The recommendations for exact amounts of these supplements seem to change often, but generally speaking 600 mg of calcium (either carbonate or citrate) and 800 units of Vitamin D per day seems prudent. Certain women may benefit from higher intake of Vitamin D.  If you are among these women, your doctor will have told you during your visit.    PAP SMEARS:  Pap smears, to check for cervical cancer or precancers,  have traditionally been done yearly, although recent scientific advances have shown that most women can have pap smears less often.  However, every woman still should have a physical exam from her gynecologist every year. It will include a breast check, inspection of the vulva and vagina to check for abnormal growths or skin changes, a visual exam of the cervix, and then an exam to evaluate the size and shape of the uterus and  ovaries.  And after 25 years of age, a rectal exam is indicated to check for rectal cancers. We will also provide age appropriate advice regarding health maintenance, like when you should have certain vaccines, screening for sexually transmitted diseases, bone density testing, colonoscopy, mammograms, etc.   HPV (Human Papillomavirus) Vaccine: What You Need to Know 1. Why get vaccinated? HPV vaccine prevents infection with human papillomavirus (HPV) types that are associated with many cancers, including:  cervical cancer in females,  vaginal and vulvar cancers in females,  anal cancer in females and males,  throat cancer in females and males, and  penile cancer in males. In addition, HPV vaccine prevents infection with HPV types that cause genital warts in both females and males. In the [place] about 12,000 women get cervical cancer every year, and about 4,000 women die from it. HPV vaccine can prevent most of these cases of cervical cancer. Vaccination is not a substitute for cervical cancer screening. This vaccine does not protect against all HPV types that can cause cervical cancer. Women should still get regular Pap tests.  HPV infection usually comes from sexual contact, and most people will become infected at some point in their life. About 14 million Americans, including teens, get infected every year. Most infections will go away on their own and not cause serious problems. But thousands of women and men get cancer and other diseases from HPV. 2. HPV vaccine HPV vaccine is approved by FDA and is recommended  by CDC for both males and females. It is routinely given at 8611 or 25 years of age, but it may be given beginning at age 389 years through age 25 years. Most adolescents 9 through 25 years of age should get HPV vaccine as a two-dose series with the doses separated by 6-12 months. People who start HPV vaccination at 25 years of age and older should get the vaccine as a three-dose series  with the second dose given 1-2 months after the first dose and the third dose given 6 months after the first dose. There are several exceptions to these age recommendations. Your health care provider can give you more information. 3. Some people should not get this vaccine  Anyone who has had a severe (life-threatening) allergic reaction to a dose of HPV vaccine should not get another dose.  Anyone who has a severe (life threatening) allergy to any component of HPV vaccine should not get the vaccine.  Tell your doctor if you have any severe allergies that you know of, including a severe allergy to yeast.  HPV vaccine is not recommended for pregnant women. If you learn that you were pregnant when you were vaccinated, there is no reason to expect any problems for you or your baby. Any woman who learns she was pregnant when she got HPV vaccine is encouraged to contact the manufacturer's registry for HPV vaccination during pregnancy at (256) 260-88311-8016538447. Women who are breastfeeding may be vaccinated.  If you have a mild illness, such as a cold, you can probably get the vaccine today. If you are moderately or severely ill, you should probably wait until you recover. Your doctor can advise you. 4. Risks of a vaccine reaction With any medicine, including vaccines, there is a chance of side effects. These are usually mild and go away on their own, but serious reactions are also possible. Most people who get HPV vaccine do not have any serious problems with it. Mild or moderate problems following HPV vaccine:  Reactions in the arm where the shot was given:  Soreness (about 9 people in 10)  Redness or swelling (about 1 person in 3)  Fever:  Mild (100F) (about 1 person in 10)  Moderate (102F) (about 1 person in 65)  Other problems:  Headache (about 1 person in 3) Problems that could happen after any injected vaccine:  People sometimes faint after a medical procedure, including vaccination.  Sitting or lying down for about 15 minutes can help prevent fainting, and injuries caused by a fall. Tell your doctor if you feel dizzy, or have vision changes or ringing in the ears.  Some people get severe pain in the shoulder and have difficulty moving the arm where a shot was given. This happens very rarely.  Any medication can cause a severe allergic reaction. Such reactions from a vaccine are very rare, estimated at about 1 in a million doses, and would happen within a few minutes to a few hours after the vaccination. As with any medicine, there is a very remote chance of a vaccine causing a serious injury or death. The safety of vaccines is always being monitored. For more information, visit: http://floyd.org/www.cdc.gov/vaccinesafety/. 5. What if there is a serious reaction? What should I look for? Look for anything that concerns you, such as signs of a severe allergic reaction, very high fever, or unusual behavior. Signs of a severe allergic reaction can include hives, swelling of the face and throat, difficulty breathing, a fast heartbeat, dizziness, and weakness.  These would usually start a few minutes to a few hours after the vaccination. What should I do? If you think it is a severe allergic reaction or other emergency that can't wait, call 9-1-1 or get to the nearest hospital. Otherwise, call your doctor. Afterward, the reaction should be reported to the Vaccine Adverse Event Reporting System (VAERS). Your doctor should file this report, or you can do it yourself through the VAERS web site at www.vaers.LAgents.no, or by calling 1-8026108653. VAERS does not give medical advice. 6. The National Vaccine Injury Compensation Program The [employer] Vaccine Injury Compensation Program (VICP) is a federal program that was created to compensate people who may have been injured by certain vaccines. Persons who believe they may have been injured by a vaccine can learn about the program and about filing a claim by  calling 1-(808)568-5644 or visiting the VICP website at SpiritualWord.at. There is a time limit to file a claim for compensation. 7. How can I learn more?  Ask your health care provider. He or she can give you the vaccine package insert or suggest other sources of information.  Call your local or state health department.  Contact the Centers for Disease Control and Prevention (CDC):  Call 330-596-6049 (1-800-CDC-INFO) or  Visit CDC's website at RunningConvention.de Vaccine Information Statement, HPV Vaccine (05/27/2015) This information is not intended to replace advice given to you by your health care provider. Make sure you discuss any questions you have with your health care provider. Document Released: 01/06/2014 Document Revised: 03/01/2016 Document Reviewed: 03/01/2016 Elsevier Interactive Patient Education  2017 [employer].

## 2016-07-05 NOTE — Progress Notes (Signed)
26 y.o. G0P0000 SingleCaucasianF here for annual exam.  She was originally started on OCP's to prevent ovarian cysts. For her last 2 cycles she is getting migraines the whole week of the placebo pills. On her heaviest day she saturates a regular tampon in 4 hour. No aura's. Sexually active, first partner, together x 8 months. Not using condoms consistently. No dyspareunia. She can have spotting with intercourse, slight pain at the start, then fine. She has a h/o a hymenectomy. No baseline pain.  Period Cycle (Days): 28 Period Duration (Days): 5 Period Pattern: Regular Menstrual Flow: Light, Moderate Menstrual Control: Tampon, Panty liner Menstrual Control Change Freq (Hours): 4 Dysmenorrhea: (!) Mild Dysmenorrhea Symptoms: Headache  Patient's last menstrual period was 06/18/2016.          Sexually active: Yes.    The current method of family planning is OCP (estrogen/progesterone).    Exercising: No.  The patient does not participate in regular exercise at present. Smoker:  no  Health Maintenance: Pap:  12/22/13 Neg  History of abnormal Pap:  no MMG:  N/A TDaP:  Current  Gardasil: Not done   reports that she has never smoked. She has never used smokeless tobacco. She reports that she does not drink alcohol or use drugs. She is studying at [employer], in [place]. Getting her Masters in [employer].   No past medical history on file.  Past Surgical History:  Procedure Laterality Date  . broken arm     left/set under an  . broken arm     right arm x2 (soccer)  . HYMENOTOMY / HYMENECTOMY  6/12    Current Outpatient Prescriptions  Medication Sig Dispense Refill  . Acetaminophen (TYLENOL PO) Take by mouth as needed.    . IBUPROFEN PO Take by mouth as needed.    Colleen Can FE 1/20 1-20 MG-MCG tablet TAKE 1 TABLET BY MOUTH DAILY. 84 tablet 0  . Nutritional Supplements (JUICE PLUS FIBRE PO) Take by mouth.    . SUMAtriptan (IMITREX) 100 MG tablet Take one tab at headache onset.  May  repeat in 2 hours if headache persists or recurs. 9 tablet 3   No current facility-administered medications for this visit.     Family History  Problem Relation Age of Onset  . Diabetes Paternal Grandfather   . Intestinal polyp Maternal Grandmother     cancerous  . Infertility Mother     on meds for second child    Review of Systems  Constitutional: Negative.   HENT: Negative.   Respiratory: Negative.   Cardiovascular: Negative.   Gastrointestinal: Negative.   Endocrine: Negative.   Genitourinary: Positive for dysuria.       Bleeding with intercourse   Musculoskeletal: Negative.   Skin: Negative.   Allergic/Immunologic: Negative.   Neurological: Negative.   Hematological: Negative.   Psychiatric/Behavioral: Negative.   Occasionally has pain with urination, usually comes after she is sexually active and external.   Exam:   BP 118/60 (BP Location: Right Arm, Patient Position: Sitting, Cuff Size: Normal)   Pulse 96   Resp 16   Ht 5' 4.75" (1.645 m)   Wt 136 lb (61.7 kg)   LMP 06/18/2016   BMI 22.81 kg/m   Weight change: @WEIGHTCHANGE @ Height:   Height: 5' 4.75" (164.5 cm)  Ht Readings from Last 3 Encounters:  07/05/16 5' 4.75" (1.645 m)  03/17/15 5\' 5"  (1.651 m)  12/22/13 5\' 5"  (1.651 m)    General appearance: alert, cooperative and appears stated age  Head: Normocephalic, without obvious abnormality, atraumatic Neck: no adenopathy, supple, symmetrical, trachea midline and thyroid normal to inspection and palpation Lungs: clear to auscultation bilaterally Cardiovascular: regular rate and rhythm Breasts: normal appearance, no masses or tenderness Heart: regular rate and rhythm Abdomen: soft, non-tender; bowel sounds normal; no masses,  no organomegaly Extremities: extremities normal, atraumatic, no cyanosis or edema Skin: Skin color, texture, turgor normal. No rashes or lesions Lymph nodes: Cervical, supraclavicular, and axillary nodes normal. No abnormal inguinal  nodes palpated Neurologic: Grossly normal   Pelvic: External genitalia:  no lesions              Urethra:  normal appearing urethra with no masses, tenderness or lesions              Bartholins and Skenes: normal                 Vagina: normal appearing vagina with normal color, slight increase in white watery d/c (denies symptoms)              Cervix: no lesions               Bimanual Exam:  Uterus:  normal size, contour, position, consistency, mobility, non-tender and anteverted              Adnexa: no mass, fullness, tenderness               Rectovaginal: Confirms               Anus:  normal sphincter tone, no lesions  Chaperone was present for exam.  A:  Well Woman with normal exam  Dysuria, thinks external, will send urine to be sure  Menstrual migraines  P:   Start gardasil today  Pap with GC/CT, declines other STD's  Declines screening labs  Discussed breast self awareness  Discussed calcium and vit D intake  [facility]  Urine for ua, c&s

## 2016-07-06 LAB — URINE CULTURE

## 2016-07-09 LAB — IPS PAP SMEAR ONLY

## 2016-07-09 LAB — IPS N GONORRHOEA AND CHLAMYDIA BY PCR

## 2016-08-17 ENCOUNTER — Other Ambulatory Visit: Payer: Self-pay | Admitting: Obstetrics and Gynecology

## 2016-08-17 NOTE — Telephone Encounter (Signed)
Medication refill request: JUNEL FE Last AEX:  07/05/16 JJ Next AEX: 07/10/17 Last MMG (if hormonal medication request):n/a Refill authorized: 07/05/16 #1 pack w/4 refills

## 2016-08-30 ENCOUNTER — Ambulatory Visit: Payer: BLUE CROSS/BLUE SHIELD

## 2017-07-10 ENCOUNTER — Ambulatory Visit: Payer: BLUE CROSS/BLUE SHIELD | Admitting: Obstetrics and Gynecology

## 2017-10-09 ENCOUNTER — Other Ambulatory Visit: Payer: Self-pay | Admitting: Obstetrics and Gynecology

## 2017-10-09 NOTE — Telephone Encounter (Signed)
Medication refill request: OCP  Last AEX:  07-05-16 Next AEX: left message for patient to call and schedule AEX  Last MMG (if hormonal medication request): N/A Refill authorized: please advise

## 2017-10-29 ENCOUNTER — Other Ambulatory Visit: Payer: Self-pay | Admitting: Obstetrics and Gynecology

## 2017-10-30 ENCOUNTER — Telehealth: Payer: Self-pay | Admitting: Obstetrics and Gynecology

## 2017-10-30 MED ORDER — SUMATRIPTAN SUCCINATE 100 MG PO TABS: ORAL_TABLET | ORAL | 0 refills | Status: AC

## 2017-10-30 NOTE — Telephone Encounter (Signed)
Patient calling to find out why her prescription was denied.

## 2017-10-30 NOTE — Telephone Encounter (Signed)
Please inform the patient 9 more tablets sent. She needs to establish care with a provider where she is living to continue her script. Please wish her my best.

## 2017-10-30 NOTE — Telephone Encounter (Signed)
Spoke with patient. Patient is calling about her refill for Sumatriptan. States that she has moved to [place] and is out of rx. Asking for a refill to get her through until she establishes with a PCP there for further fills of the medication. Advised will review with Dr.Jertson and return call.

## 2017-10-31 NOTE — Telephone Encounter (Signed)
Spoke with patient. Advised of message as seen below from Dr.Jertson. Patient verbalizes understanding. Encounter closed.
